# Patient Record
Sex: Female | Born: 1980 | Race: Black or African American | Hispanic: No | Marital: Married | Smoking: Former smoker
Health system: Southern US, Community
[De-identification: ages and names within clinical notes are randomized; demographics above are authoritative.]

## PROBLEM LIST (undated history)

## (undated) DIAGNOSIS — N289 Disorder of kidney and ureter, unspecified: Secondary | ICD-10-CM

## (undated) DIAGNOSIS — Z8759 Personal history of other complications of pregnancy, childbirth and the puerperium: Secondary | ICD-10-CM

## (undated) DIAGNOSIS — Z30017 Encounter for initial prescription of implantable subdermal contraceptive: Principal | ICD-10-CM

## (undated) DIAGNOSIS — R87629 Unspecified abnormal cytological findings in specimens from vagina: Secondary | ICD-10-CM

## (undated) HISTORY — DX: Personal history of other complications of pregnancy, childbirth and the puerperium: Z87.59

## (undated) HISTORY — DX: Encounter for initial prescription of implantable subdermal contraceptive: Z30.017

## (undated) HISTORY — PX: TONSILLECTOMY: SUR1361

## (undated) HISTORY — DX: Unspecified abnormal cytological findings in specimens from vagina: R87.629

---

## 2000-11-28 ENCOUNTER — Other Ambulatory Visit: Admission: RE | Admit: 2000-11-28 | Discharge: 2000-11-28 | Payer: Self-pay | Admitting: Obstetrics and Gynecology

## 2001-05-17 ENCOUNTER — Inpatient Hospital Stay (HOSPITAL_COMMUNITY): Admission: AD | Admit: 2001-05-17 | Discharge: 2001-05-19 | Payer: Self-pay | Admitting: Obstetrics and Gynecology

## 2010-04-13 ENCOUNTER — Other Ambulatory Visit: Admission: RE | Admit: 2010-04-13 | Discharge: 2010-04-13 | Payer: Self-pay | Admitting: Obstetrics & Gynecology

## 2010-07-08 ENCOUNTER — Ambulatory Visit (HOSPITAL_COMMUNITY)
Admission: RE | Admit: 2010-07-08 | Discharge: 2010-07-08 | Payer: Self-pay | Source: Home / Self Care | Attending: Obstetrics and Gynecology | Admitting: Obstetrics and Gynecology

## 2010-07-08 ENCOUNTER — Emergency Department (HOSPITAL_COMMUNITY)
Admission: EM | Admit: 2010-07-08 | Discharge: 2010-07-08 | Payer: Self-pay | Source: Home / Self Care | Admitting: Emergency Medicine

## 2010-07-08 LAB — COMPREHENSIVE METABOLIC PANEL
ALT: 37 U/L — ABNORMAL HIGH (ref 0–35)
AST: 26 U/L (ref 0–37)
Albumin: 3.1 g/dL — ABNORMAL LOW (ref 3.5–5.2)
Calcium: 8.8 mg/dL (ref 8.4–10.5)
Creatinine, Ser: 0.5 mg/dL (ref 0.4–1.2)
GFR calc Af Amer: >60 mL/min (ref >60)
Sodium: 135 mEq/L (ref 135–145)
Total Protein: 5.9 g/dL — ABNORMAL LOW (ref 6.0–8.3)

## 2010-07-08 LAB — DIFFERENTIAL
Basophils Absolute: 0.1 10*3/uL (ref 0.0–0.1)
Basophils Relative: 0 % (ref 0–1)
Eosinophils Absolute: 0.2 10*3/uL (ref 0.0–0.7)
Monocytes Absolute: 1.1 10*3/uL — ABNORMAL HIGH (ref 0.1–1.0)
Monocytes Relative: 8 % (ref 3–12)
Neutro Abs: 10 10*3/uL — ABNORMAL HIGH (ref 1.7–7.7)
Neutrophils Relative %: 72 % (ref 43–77)

## 2010-07-08 LAB — CBC
Hemoglobin: 9.2 g/dL — ABNORMAL LOW (ref 12.0–15.0)
MCH: 23 pg — ABNORMAL LOW (ref 26.0–34.0)
MCHC: 33.5 g/dL (ref 30.0–36.0)
Platelets: 197 10*3/uL (ref 150–400)

## 2010-07-08 LAB — URINALYSIS, ROUTINE W REFLEX MICROSCOPIC
Bilirubin Urine: NEGATIVE
Specific Gravity, Urine: 1.025 (ref 1.005–1.030)
Urine Glucose, Fasting: NEGATIVE mg/dL
Urobilinogen, UA: 0.2 mg/dL (ref 0.0–1.0)
pH: 6 (ref 5.0–8.0)

## 2010-07-08 LAB — URINE MICROSCOPIC-ADD ON

## 2010-07-08 LAB — POCT CARDIAC MARKERS
CKMB, poc: <1 ng/mL — ABNORMAL LOW (ref 1.0–8.0)
Myoglobin, poc: 16 ng/mL (ref 12–200)

## 2010-10-30 NOTE — Op Note (Signed)
[facility]  Patient:    Carrie Finley, Carrie Finley Visit Number: [id number] MRN: [id number]          Service Type: OBS Location: 4A A427 01 Attending Physician:  Tilda Burrow Dictated by:   Zerita Boers, CNM Admit Date:  05/17/2001   CC:         [facility]   Operative Report  ONSET OF LABOR:  May 17, 2001 at 12:30 p.m.  DATE OF DELIVERY:  May 17, 2001 at 2005 hours.  FIRST STAGE OF LABOR:  7 hours 20 minutes.  LENGTH OF SECOND STAGE OF LABOR:  15 minutes.  LENGTH OF THIRD STAGE OF LABOR:  6 minutes.  DELIVERY NOTE:  [name] had a normal spontaneous vaginal delivery from a rotation from occiput posterior to occiput anterior on perineum with spontaneous delivery of head.  Upon delivery of head, the nuchal cord was noted which was tight.  It was loosened without much difficulty and the infant spontaneously delivered without any difficulty.  Apgars were 9 and 9.  Infant weight was 7 pounds 15 ounces.  Female child without complications.  Perineum was intact upon inspection.  Third stage of labor was actively managed with 20 units of Pitocin and 1000 cc of D-5 lactated Ringers at a rapid rate. Placenta was delivered via Tomasa Blase mechanism.  Three-vessel cord was noted. Also prophylactically Hemabate 125 IM was given.  Estimated blood loss was approximately 300 cc.  Infant and mother were both stabilized and transferred out to the postpartum unit in stable condition. Dictated by:   Zerita Boers, CNM Attending Physician:  Tilda Burrow DD:  05/17/01 TD:  05/17/01 Job: [id number]

## 2010-11-01 ENCOUNTER — Inpatient Hospital Stay (HOSPITAL_COMMUNITY)
Admission: AD | Admit: 2010-11-01 | Discharge: 2010-11-03 | DRG: 775 | Disposition: A | Discharge status: Home / Self Care | Payer: Medicaid Other | Source: Ambulatory Visit | Attending: Obstetrics & Gynecology | Admitting: Obstetrics & Gynecology

## 2010-11-01 LAB — CBC
HCT: 33 % — ABNORMAL LOW (ref 36.0–46.0)
MCHC: 32.4 g/dL (ref 30.0–36.0)
Platelets: 115 10*3/uL — ABNORMAL LOW (ref 150–400)
RDW: 15.2 % (ref 11.5–15.5)

## 2010-11-02 LAB — ABO/RH: ABO/RH(D): O POS

## 2014-07-18 ENCOUNTER — Encounter: Payer: Self-pay | Admitting: Obstetrics and Gynecology

## 2014-07-18 ENCOUNTER — Ambulatory Visit (INDEPENDENT_AMBULATORY_CARE_PROVIDER_SITE_OTHER): Payer: BLUE CROSS/BLUE SHIELD | Admitting: Obstetrics and Gynecology

## 2014-07-18 VITALS — BP 100/60 | Ht 62.0 in | Wt 134.0 lb

## 2014-07-18 DIAGNOSIS — O034 Incomplete spontaneous abortion without complication: Secondary | ICD-10-CM

## 2014-07-18 NOTE — Patient Instructions (Signed)
Miscarriage A miscarriage is the sudden loss of an unborn baby (fetus) before the 20th week of pregnancy. Most miscarriages happen in the first 3 months of pregnancy. Sometimes, it happens before a woman even knows she is pregnant. A miscarriage is also called a "spontaneous miscarriage" or "early pregnancy loss." Having a miscarriage can be an emotional experience. Talk with your caregiver about any questions you may have about miscarrying, the grieving process, and your future pregnancy plans. CAUSES   Problems with the fetal chromosomes that make it impossible for the baby to develop normally. Problems with the baby's genes or chromosomes are most often the result of errors that occur, by chance, as the embryo divides and grows. The problems are not inherited from the parents.  Infection of the cervix or uterus.   Hormone problems.   Problems with the cervix, such as having an incompetent cervix. This is when the tissue in the cervix is not strong enough to hold the pregnancy.   Problems with the uterus, such as an abnormally shaped uterus, uterine fibroids, or congenital abnormalities.   Certain medical conditions.   Smoking, drinking alcohol, or taking illegal drugs.   Trauma.  Often, the cause of a miscarriage is unknown.  SYMPTOMS   Vaginal bleeding or spotting, with or without cramps or pain.  Pain or cramping in the abdomen or lower back.  Passing fluid, tissue, or blood clots from the vagina. DIAGNOSIS  Your caregiver will perform a physical exam. You may also have an ultrasound to confirm the miscarriage. Blood or urine tests may also be ordered. TREATMENT   Sometimes, treatment is not necessary if you naturally pass all the fetal tissue that was in the uterus. If some of the fetus or placenta remains in the body (incomplete miscarriage), tissue left behind may become infected and must be removed. Usually, a dilation and curettage (D and C) procedure is performed.  During a D and C procedure, the cervix is widened (dilated) and any remaining fetal or placental tissue is gently removed from the uterus.  Antibiotic medicines are prescribed if there is an infection. Other medicines may be given to reduce the size of the uterus (contract) if there is a lot of bleeding.  If you have Rh negative blood and your baby was Rh positive, you will need a Rh immunoglobulin shot. This shot will protect any future baby from having Rh blood problems in future pregnancies. HOME CARE INSTRUCTIONS   Your caregiver may order bed rest or may allow you to continue light activity. Resume activity as directed by your caregiver.  Have someone help with home and family responsibilities during this time.   Keep track of the number of sanitary pads you use each day and how soaked (saturated) they are. Write down this information.   Do not use tampons. Do not douche or have sexual intercourse until approved by your caregiver.   Only take over-the-counter or prescription medicines for pain or discomfort as directed by your caregiver.   Do not take aspirin. Aspirin can cause bleeding.   Keep all follow-up appointments with your caregiver.   If you or your partner have problems with grieving, talk to your caregiver or seek counseling to help cope with the pregnancy loss. Allow enough time to grieve before trying to get pregnant again.  SEEK IMMEDIATE MEDICAL CARE IF:   You have severe cramps or pain in your back or abdomen.  You have a fever.  You pass large blood clots (walnut-sized   or larger) ortissue from your vagina. Save any tissue for your caregiver to inspect.   Your bleeding increases.   You have a thick, bad-smelling vaginal discharge.  You become lightheaded, weak, or you faint.   You have chills.  MAKE SURE YOU:  Understand these instructions.  Will watch your condition.  Will get help right away if you are not doing well or get  worse. Document Released: 11/24/2000 Document Revised: 09/25/2012 Document Reviewed: 07/20/2011 Exit[employer] Patient Information 2015 [employer]. This information is not intended to replace advice given to you by your health care provider. Make sure you discuss any questions you have with your health care provider.  

## 2014-07-18 NOTE — Progress Notes (Signed)
Patient ID: Carrie MannsJamie L Kleen, female   DOB: July 25, 1980, 34 y.o.   MRN: [id number] Pt here today for follow up from miscarriage. Pt states that she is still having a lot of pain and heavy bleeding. LMP 04/03/14. Pt had US while at ER.

## 2014-07-18 NOTE — Progress Notes (Signed)
Patient ID: Carrie MannsJamie L Marines, female   DOB: 29-Jan-1981, 34 y.o.   MRN: [id number]   [facility]  Patient name: Carrie Finley MRN [id number]  Date of birth: 29-Jan-1981  CC & HPI:  Carrie Finley is a 34 y.o. female presenting today for a follow-up of a miscarriage.  She went to an ED in [place] 5 days ago due to vaginal bleeding and had a transvaginal u/s which revealed that the fetus did not have a heartbeat.  She was not given Cytotec at that time and has been experiencing heavy vaginal bleeding and lower abdominal cramping since that visit.  Her last LNMP was in October 2015.  Her blood type is O+.     ROS:  All systems have been reviewed and are negative unless otherwise indicated in the HPI.  Pertinent History Reviewed:   Reviewed: Significant for  Medical        History reviewed. No pertinent past medical history.                            Surgical Hx:    Past Surgical History  Procedure Laterality Date  . Tonsillectomy     Medications: Reviewed & Updated - see associated section                       Current outpatient prescriptions:  .  cephALEXin (KEFLEX) 500 MG capsule, Take 500 mg by mouth 4 (four) times daily., Disp: , Rfl:  .  HYDROcodone-acetaminophen (NORCO/VICODIN) 5-325 MG per tablet, Take 1 tablet by mouth every 6 (six) hours as needed for moderate pain., Disp: , Rfl:    Social History: Reviewed -  reports that she has never smoked. She has never used smokeless tobacco.  Objective Findings:  Vitals: Blood pressure 100/60, height 5\' 2"  (1.575 m), weight 134 lb (60.782 kg).  Physical Examination: General appearance - alert, well appearing, and in no distress, oriented to person, place, and time and normal appearing weight Pelvic - VULVA: normal appearing vulva with no masses, tenderness or lesions,  VAGINA: normal appearing vagina with normal color and discharge, no lesions,  CERVIX: open 2-3cm, large amount of old, dark blood, tissue fragments,  and clots UTERUS: uterus is normal size, shape, consistency and nontender,  ADNEXA: normal adnexa in size, nontender and no masses  Removed large pieces of tissue fragments and clots , with portions sent for tissue confirmation.  Gyn Ultrasound done at completion of removal of tissue from  Assessment & Plan:   A:  1. Miscarriage now completed  2. O positive  P:  1. Follow-up in 10 days  This chart was scribed for Tilda BurrowJohn Havannah Streat V, MD by Carl Bestelina Holson, ED Scribe. This patient was seen in Room 2 and the patient's care was started at 11:00 AM.

## 2014-07-29 ENCOUNTER — Ambulatory Visit: Payer: BLUE CROSS/BLUE SHIELD | Admitting: Obstetrics and Gynecology

## 2014-07-30 ENCOUNTER — Encounter: Payer: Self-pay | Admitting: Obstetrics and Gynecology

## 2014-08-08 ENCOUNTER — Ambulatory Visit (INDEPENDENT_AMBULATORY_CARE_PROVIDER_SITE_OTHER): Payer: BLUE CROSS/BLUE SHIELD | Admitting: Adult Health

## 2014-08-08 ENCOUNTER — Encounter: Payer: Self-pay | Admitting: Adult Health

## 2014-08-08 VITALS — BP 118/76 | HR 83 | Ht 62.0 in | Wt 137.5 lb

## 2014-08-08 DIAGNOSIS — Z8742 Personal history of other diseases of the female genital tract: Secondary | ICD-10-CM

## 2014-08-08 DIAGNOSIS — Z8759 Personal history of other complications of pregnancy, childbirth and the puerperium: Secondary | ICD-10-CM

## 2014-08-08 NOTE — Patient Instructions (Addendum)
No sex Call if wants nexplanon after insurance checked

## 2014-08-08 NOTE — Progress Notes (Signed)
Subjective:     Patient ID: Carrie Finley, female   DOB: Nov 11, 1980, 34 y.o.   MRN: [id number]  HPI Carrie Finley is a 34 year old black female in for follow up of recent miscarriage.Was seen 2/4 by Dr Emelda FearFerguson then and had tissue removed and US.Bleeding has stopped and has no pain.May want nexplanon.Bllod type O+ per chart.  Review of Systems  Patient denies any headaches, hearing loss, fatigue, blurred vision, shortness of breath, chest pain, abdominal pain, problems with bowel movements, urination, or intercourse(not having sex). No joint pain or mood swings.  Reviewed past medical,surgical, social and family history. Reviewed medications and allergies.     Objective:   Physical Exam BP 118/76 mmHg  Pulse 83  Ht 5\' 2"  (1.575 m)  Wt 137 lb 8 oz (62.37 kg)  BMI 25.14 kg/m2   Skin warm and dry.Pelvic: external genitalia is normal in appearance no lesions, vagina: tan discharge without odor,urethra has no lesions or masses noted, cervix:smooth and bulbous, uterus: normal size, shape and contour, non tender, no masses felt, adnexa: no masses or tenderness noted. Bladder is non tender and no masses felt. Discussed nexplanon and she wants to consider this, after insurance check.  Assessment:     Recent miscarriage    Plan:    No sex til after nexplanon inserted Check QHCG Check insurance on nexplanon by Angie and call pt Review handout on nexplanon, if wants nexplanon after insurance check, [facility] and [facility]on in pm

## 2014-08-09 LAB — HCG, QUANTITATIVE, PREGNANCY: hCG Quant: 17 m[IU]/mL

## 2014-08-12 ENCOUNTER — Telehealth: Payer: Self-pay | Admitting: Adult Health

## 2014-08-12 NOTE — Telephone Encounter (Signed)
Wrong number, was [place]

## 2014-08-22 ENCOUNTER — Ambulatory Visit (INDEPENDENT_AMBULATORY_CARE_PROVIDER_SITE_OTHER): Payer: BLUE CROSS/BLUE SHIELD | Admitting: Adult Health

## 2014-08-22 ENCOUNTER — Encounter: Payer: Self-pay | Admitting: Adult Health

## 2014-08-22 VITALS — BP 128/70 | HR 70 | Ht 62.0 in | Wt 139.0 lb

## 2014-08-22 DIAGNOSIS — Z3202 Encounter for pregnancy test, result negative: Secondary | ICD-10-CM

## 2014-08-22 DIAGNOSIS — Z30017 Encounter for initial prescription of implantable subdermal contraceptive: Secondary | ICD-10-CM

## 2014-08-22 DIAGNOSIS — Z30018 Encounter for initial prescription of other contraceptives: Secondary | ICD-10-CM | POA: Diagnosis not present

## 2014-08-22 HISTORY — DX: Encounter for initial prescription of implantable subdermal contraceptive: Z30.017

## 2014-08-22 LAB — POCT URINE PREGNANCY: Preg Test, Ur: NEGATIVE

## 2014-08-22 NOTE — Progress Notes (Signed)
Subjective:     Patient ID: Carrie Finley, female   DOB: [birth year], 34 y.o.   MRN: [id number]  HPI Carrie Finley is a 34 year old black female in for nexplanon insertion.  Review of Systems For nexplanon insertion, Patient denies any headaches, hearing loss, fatigue, blurred vision, shortness of breath, chest pain, abdominal pain, problems with bowel movements, urination, or intercourse. No joint pain or mood swings. Reviewed past medical,surgical, social and family history. Reviewed medications and allergies.     Objective:   Physical Exam BP 128/70 mmHg  Pulse 70  Ht 5\' 2"  (1.575 m)  Wt 139 lb (63.05 kg)  BMI 25.42 kg/m2  LMP 08/19/2014 UPT negative, Consent signed, time out called. Left arm cleansed with betadine, and injected with 1.5 cc 2% lidocaine and waited til numb. Nexplanon easily inserted and steri strips applied.Rod easily palpated by provider and pt. Pressure dressing applied.    Assessment:     Nexplanon insertion lot [id number] exp 3/18    Plan:     Use condoms x 2 weeks, keep clean and dry x 24 hours, no heavy lifting, keep steri strips on x 72 hours, Keep pressure dressing on x 24 hours. Follow up prn problems.   Return in 3 months for pap and physical

## 2014-08-22 NOTE — Patient Instructions (Signed)
Use condoms x 2 weeks, keep clean and dry x 24 hours, no heavy lifting, keep steri strips on x 72 hours, Keep pressure dressing on x 24 hours. Follow up prn problems. Return in 3 months for pap and physical

## 2014-11-21 ENCOUNTER — Other Ambulatory Visit: Payer: BLUE CROSS/BLUE SHIELD | Admitting: Adult Health

## 2014-11-21 ENCOUNTER — Encounter: Payer: Self-pay | Admitting: Adult Health

## 2020-11-07 ENCOUNTER — Emergency Department (HOSPITAL_COMMUNITY): Payer: BC Managed Care – PPO

## 2020-11-07 ENCOUNTER — Encounter (HOSPITAL_COMMUNITY): Payer: Self-pay

## 2020-11-07 ENCOUNTER — Emergency Department (HOSPITAL_COMMUNITY)
Admission: EM | Admit: 2020-11-07 | Discharge: 2020-11-07 | Disposition: A | Discharge status: Home / Self Care | Payer: BC Managed Care – PPO | Attending: Emergency Medicine | Admitting: Emergency Medicine

## 2020-11-07 ENCOUNTER — Other Ambulatory Visit: Payer: Self-pay

## 2020-11-07 DIAGNOSIS — N10 Acute tubulo-interstitial nephritis: Secondary | ICD-10-CM | POA: Insufficient documentation

## 2020-11-07 DIAGNOSIS — Z87891 Personal history of nicotine dependence: Secondary | ICD-10-CM | POA: Insufficient documentation

## 2020-11-07 DIAGNOSIS — R1011 Right upper quadrant pain: Secondary | ICD-10-CM

## 2020-11-07 DIAGNOSIS — N12 Tubulo-interstitial nephritis, not specified as acute or chronic: Secondary | ICD-10-CM

## 2020-11-07 HISTORY — DX: Disorder of kidney and ureter, unspecified: N28.9

## 2020-11-07 LAB — CBC
HCT: 34.3 % — ABNORMAL LOW (ref 36.0–46.0)
Hemoglobin: 11.1 g/dL — ABNORMAL LOW (ref 12.0–15.0)
MCH: 23.8 pg — ABNORMAL LOW (ref 26.0–34.0)
MCHC: 32.4 g/dL (ref 30.0–36.0)
MCV: 73.6 fL — ABNORMAL LOW (ref 80.0–100.0)
Platelets: 268 10*3/uL (ref 150–400)
RBC: 4.66 MIL/uL (ref 3.87–5.11)
RDW: 14.3 % (ref 11.5–15.5)
WBC: 9.8 10*3/uL (ref 4.0–10.5)
nRBC: 0 % (ref 0.0–0.2)

## 2020-11-07 LAB — LIPASE, BLOOD: Lipase: 33 U/L (ref 11–51)

## 2020-11-07 LAB — URINALYSIS, ROUTINE W REFLEX MICROSCOPIC
Bilirubin Urine: NEGATIVE
Glucose, UA: NEGATIVE mg/dL
Hgb urine dipstick: NEGATIVE
Ketones, ur: NEGATIVE mg/dL
Nitrite: NEGATIVE
Protein, ur: NEGATIVE mg/dL
Specific Gravity, Urine: 1.014 (ref 1.005–1.030)
pH: 7 (ref 5.0–8.0)

## 2020-11-07 LAB — COMPREHENSIVE METABOLIC PANEL
ALT: 30 U/L (ref 0–44)
AST: 30 U/L (ref 15–41)
Albumin: 3.7 g/dL (ref 3.5–5.0)
Alkaline Phosphatase: 72 U/L (ref 38–126)
Anion gap: 10 (ref 5–15)
BUN: 8 mg/dL (ref 6–20)
CO2: 22 mmol/L (ref 22–32)
Calcium: 9.2 mg/dL (ref 8.9–10.3)
Chloride: 104 mmol/L (ref 98–111)
Creatinine, Ser: 0.7 mg/dL (ref 0.44–1.00)
GFR, Estimated: >60 mL/min (ref >60)
Glucose, Bld: 96 mg/dL (ref 70–99)
Potassium: 3.9 mmol/L (ref 3.5–5.1)
Sodium: 136 mmol/L (ref 135–145)
Total Bilirubin: 0.7 mg/dL (ref 0.3–1.2)
Total Protein: 7.3 g/dL (ref 6.5–8.1)

## 2020-11-07 LAB — I-STAT BETA HCG BLOOD, ED (MC, WL, AP ONLY): I-stat hCG, quantitative: <5 m[IU]/mL (ref <5)

## 2020-11-07 MED ORDER — ONDANSETRON 4 MG PO TBDP: 4.0000 mg | ORAL_TABLET | Freq: Three times a day (TID) | ORAL | 0 refills | Status: AC | PRN

## 2020-11-07 MED ORDER — ONDANSETRON 4 MG PO TBDP
4.0000 mg | ORAL_TABLET | Freq: Once | ORAL | Status: AC
  Administered 2020-11-07: 4 mg via ORAL
  Filled 2020-11-07: qty 1

## 2020-11-07 MED ORDER — OXYCODONE-ACETAMINOPHEN 5-325 MG PO TABS
1.0000 | ORAL_TABLET | Freq: Once | ORAL | Status: AC
  Administered 2020-11-07: 1 via ORAL
  Filled 2020-11-07: qty 1

## 2020-11-07 MED ORDER — SULFAMETHOXAZOLE-TRIMETHOPRIM 800-160 MG PO TABS: 1.0000 | ORAL_TABLET | Freq: Two times a day (BID) | ORAL | 0 refills | Status: AC

## 2020-11-07 MED ORDER — SULFAMETHOXAZOLE-TRIMETHOPRIM 800-160 MG PO TABS
1.0000 | ORAL_TABLET | Freq: Once | ORAL | Status: AC
  Administered 2020-11-07: 1 via ORAL
  Filled 2020-11-07: qty 1

## 2020-11-07 MED ORDER — OXYCODONE-ACETAMINOPHEN 5-325 MG PO TABS: 1.0000 | ORAL_TABLET | Freq: Four times a day (QID) | ORAL | 0 refills | Status: AC | PRN

## 2020-11-07 NOTE — ED Provider Notes (Signed)
Emergency Medicine Provider Triage Evaluation Note  Carrie Finley , a 40 y.o. female  was evaluated in triage.  Pt complains of abd pain that started a few days ago. Pain was initially in the luq and now it is located to the [place]. Reports nausea, but no vomiting or diarrhea. Denies fevers, urinary sxs  Review of Systems  Positive: abd pain, nausea, Negative: Fevers, vomiting, diarrhea, fevers, urinary sxs  Physical Exam  BP 134/82 (BP Location: Left Arm)   Pulse (!) 101   Temp 98.6 F (37 C) (Oral)   Resp 17   Ht 5\' 2"  (1.575 m)   Wt 68.5 kg   LMP 10/31/2020 (Approximate)   SpO2 100%   BMI 27.62 kg/m  Gen:   Awake, no distress   Resp:  Normal effort  MSK:   Moves extremities without difficulty  Other:  Epigastric, luq and ruq abd ttp  Medical Decision Making  Medically screening exam initiated at 3:29 PM.  Appropriate orders placed.  ARYN SAFRAN was informed that the remainder of the evaluation will be completed by another provider, this initial triage assessment does not replace that evaluation, and the importance of remaining in the ED until their evaluation is complete.     Roxy Manns 11/07/20 1532    11/09/20, MD 11/08/20 2016

## 2020-11-07 NOTE — Discharge Instructions (Addendum)
It is important that you stay hydrated. Take the antibiotic, bactrim, every 12 hours until gone. You can take zofran as directed for nausea. You can take the percocet for severe pain. Be aware this medication can make you drowsy. Do not operate motor vehicles or drink alcohol while taking this medication. Follow-up with your primary care provider within 3 to 5 days. Discuss your incidental finding with your liver for nonemergent MRI.  Return to the emergency department for high fever, uncontrollable vomiting, or severely worsening symptoms. Return for shortness of breath, chest pain, coughing up blood.

## 2020-11-07 NOTE — ED Provider Notes (Signed)
[facility]-EMERGENCY DEPT Provider Note   CSN: [id number] Arrival date & time: 11/07/20  1442     History Chief Complaint  Patient presents with  . Abdominal Pain    Carrie Finley is a 40 y.o. female w PMHx nephrolithiasis, presenting for evaluation of abdominal pain that began on Sunday. Patient was initially LUQ and has migrated to RUQ.  She states she was evaluated on Wednesday at urgent care who instructed her to stop taking the ibuprofen that she was taking to treat her pain.  They recommended Nexium.  Symptoms are not improved.  Pain now favors a right upper quadrant radiating towards flank.  Abdominal pain is worse with deep breathing so she is not short of breath.  She has a lot of nausea without vomiting.  Denies diarrhea or constipation.  Denies urinary symptoms or fever.  Reports history of nephrolithiasis though symptoms feel different  The history is provided by the patient.       Past Medical History:  Diagnosis Date  . History of miscarriage   . Nexplanon insertion 08/22/2014   Inserted 08/22/14 left arm   . Renal disorder    kidney stones  . Vaginal Pap smear, abnormal     Patient Active Problem List   Diagnosis Date Noted  . Nexplanon insertion 08/22/2014  . History of miscarriage 08/08/2014    Past Surgical History:  Procedure Laterality Date  . TONSILLECTOMY       OB History    Gravida  4   Para  3   Term      Preterm      AB  1   Living  3     SAB  1   IAB      Ectopic      Multiple      Live Births              Family History  Problem Relation Age of Onset  . Lupus Sister     Social History   Tobacco Use  . Smoking status: Former Games developer  . Smokeless tobacco: Never Used  Vaping Use  . Vaping Use: Never used  Substance Use Topics  . Alcohol use: Yes    Comment: occ  . Drug use: No    Home Medications Prior to Admission medications   Medication Sig Start Date End Date Taking? Authorizing  Provider  ondansetron (ZOFRAN ODT) 4 MG disintegrating tablet Take 1 tablet (4 mg total) by mouth every 8 (eight) hours as needed for nausea or vomiting. 11/07/20  Yes Steadman Prosperi, [place] N, PA-C  oxyCODONE-acetaminophen (PERCOCET/ROXICET) 5-325 MG tablet Take 1 tablet by mouth every 6 (six) hours as needed for severe pain. 11/07/20  Yes Roxan Hockey, [place] N, PA-C  sulfamethoxazole-trimethoprim (BACTRIM DS) 800-160 MG tablet Take 1 tablet by mouth 2 (two) times daily for 10 days. 11/07/20 11/17/20 Yes Yarissa Reining, [place] N, PA-C    Allergies    Patient has no known allergies.  Review of Systems   Review of Systems  All other systems reviewed and are negative.   Physical Exam Updated Vital Signs BP 123/89   Pulse 76   Temp 98.6 F (37 C) (Oral)   Resp 18   Ht 5\' 2"  (1.575 m)   Wt 68.5 kg   LMP 10/31/2020 (Approximate)   SpO2 100%   BMI 27.62 kg/m   Physical Exam Vitals and nursing note reviewed.  Constitutional:      Appearance: She is well-developed.  HENT:     Head: Normocephalic and atraumatic.  Eyes:     Conjunctiva/sclera: Conjunctivae normal.  Cardiovascular:     Rate and Rhythm: Normal rate and regular rhythm.  Pulmonary:     Effort: Pulmonary effort is normal. No respiratory distress.     Breath sounds: Normal breath sounds.  Abdominal:     General: Bowel sounds are normal.     Palpations: Abdomen is soft.     Tenderness: There is abdominal tenderness in the right upper quadrant and epigastric area. There is right CVA tenderness. There is no guarding or rebound.     Comments: No skin changes  Skin:    General: Skin is warm.  Neurological:     Mental Status: She is alert.  Psychiatric:        Behavior: Behavior normal.     ED Results / Procedures / Treatments   Labs (all labs ordered are listed, but only abnormal results are displayed) Labs Reviewed  CBC - Abnormal; Notable for the following components:      Result Value   Hemoglobin 11.1 (*)    HCT 34.3 (*)     MCV 73.6 (*)    MCH 23.8 (*)    All other components within normal limits  URINALYSIS, ROUTINE W REFLEX MICROSCOPIC - Abnormal; Notable for the following components:   Leukocytes,Ua SMALL (*)    Bacteria, UA MANY (*)    All other components within normal limits  URINE CULTURE  LIPASE, BLOOD  COMPREHENSIVE METABOLIC PANEL  I-STAT BETA HCG BLOOD, ED ([facility], [facility], [facility] ONLY)    EKG None  Radiology CT Abdomen Pelvis Wo Contrast  Result Date: 11/07/2020 CLINICAL DATA:  Mid to upper abdominal pain EXAM: CT ABDOMEN AND PELVIS WITHOUT CONTRAST TECHNIQUE: Multidetector CT imaging of the abdomen and pelvis was performed following the standard protocol without IV contrast. COMPARISON:  Abdominal ultrasound 11/07/2020 FINDINGS: Lower chest: Lung bases are clear. Normal heart size. No pericardial effusion. Hepatobiliary: Hypoattenuating focus identified on comparison abdominal ultrasound is not well visualized on this unenhanced CT. Small subcapsular hypoattenuating focus along the anterior left lobe (2/16) too small to fully characterize on CT imaging but statistically likely benign. No other focal liver abnormality. Smooth surface contour. Normal attic attenuation. No visible calcified gallstone. No pericholecystic fluid or inflammation. No biliary ductal dilatation. Pancreas: No pancreatic ductal dilatation or surrounding inflammatory changes. Spleen: Normal in size. No concerning splenic lesions. Adrenals/Urinary Tract: Normal adrenal glands. Kidneys are symmetric in size and normally located. Bilateral punctate nonobstructing calculi in both kidneys. No obstructive urolithiasis or hydronephrosis is seen. No visible or contour deforming renal lesions. Urinary bladder is largely decompressed at the time of exam and therefore poorly evaluated by CT imaging. Mild bladder wall thickening may be related to underdistention. Stomach/Bowel: Distal esophagus, stomach and duodenum are unremarkable. Normal duodenal sweep  across the midline abdomen. No small bowel thickening or dilatation. Appendix is not visualized. No focal inflammation the vicinity of the cecum to suggest an occult appendicitis. No colonic dilatation or wall thickening. Vascular/Lymphatic: No significant vascular findings are present. No enlarged abdominal or pelvic lymph nodes. Reproductive: Anteverted uterus.  No concerning adnexal lesions. Other: No abdominopelvic free fluid or free gas. No bowel containing hernias. Musculoskeletal: No acute osseous abnormality or suspicious osseous lesion. IMPRESSION: Mild bladder wall thickening, possibly related to underdistention. Correlate for urinary symptoms and consider urinalysis. Bilateral nonobstructing nephrolithiasis. No obstructive urolith or hydronephrosis at this time. No other acute or suspicious abdominopelvic abnormality to  provide cause for patient's symptoms. Hypoattenuating focus in the left lobe liver on comparison ultrasound is not well visualized on CT. MR imaging as an on an outpatient basis would further evaluate this finding. Electronically Signed   By: Kreg ShropshirePrice  DeHay M.D.   On: 11/07/2020 19:23   US Abdomen Limited RUQ (LIVER/GB)  Result Date: 11/07/2020 CLINICAL DATA:  40 year old female with right upper quadrant abdominal pain. EXAM: ULTRASOUND ABDOMEN LIMITED RIGHT UPPER QUADRANT COMPARISON:  None. FINDINGS: Gallbladder: No gallstones or wall thickening visualized. No sonographic Murphy sign noted by sonographer. Common bile duct: Diameter: 5 mm Liver: There is a 1.7 x 2.1 x 1.8 cm echogenic lesion in the left lobe of the liver which is not characterized on this ultrasound but may represent a hemangioma. Further characterization with MRI without and with contrast is recommended. Portal vein is patent on color Doppler imaging with normal direction of blood flow towards the liver. Other: None. IMPRESSION: Indeterminate lesion in the left lobe of the liver may represent a hemangioma. Further  evaluation with MRI is recommended. Electronically Signed   By: Elgie CollardArash  Radparvar M.D.   On: 11/07/2020 17:28    Procedures Procedures   Medications Ordered in ED Medications  sulfamethoxazole-trimethoprim (BACTRIM DS) 800-160 MG per tablet 1 tablet (has no administration in time range)  oxyCODONE-acetaminophen (PERCOCET/ROXICET) 5-325 MG per tablet 1 tablet (1 tablet Oral Given 11/07/20 1925)  ondansetron (ZOFRAN-ODT) disintegrating tablet 4 mg (4 mg Oral Given 11/07/20 1925)    ED Course  I have reviewed the triage vital signs and the nursing notes.  Pertinent labs & imaging results that were available during my care of the patient were reviewed by me and considered in my medical decision making (see chart for details).    MDM Rules/Calculators/A&P                          Patient is presenting for right upper quadrant abdominal pain getting to the flank with some mild nausea, no vomiting.  She has tenderness in the upper abdomen, worse in the right upper quadrant and does radiate around the right flank.  She is afebrile here but does appear uncomfortable.  Right upper quadrant ultrasound that was ordered in triage shows no gallbladder abnormality.  Does show area in the liver that is favoring hemangioma.  However this would not account for patient's pain.  There are no skin changes.  Blood counts are overall reassuring.  UA is pending.  Considered PE, however she is not short of breath, pain is very reproducible with palpation the abdomen.  No signs are normal, no tachycardia or hypoxia, no tachypnea.  Will obtain CT scan abdomen for further assessment.  Pain and nausea treated.   CT scan is negative for acute or abnormal findings.  UA has resulted and is consistent with infection. Culture sent. Suspect patient's symptoms may be attributed to pyelonephritis.  Will treat with Bactrim, and send with prescription for symptom management.  She has improvement with treatment here.   Discussed  with patient that presentation does not seem consistent with PE though does have some risk due to her OCPs.  If she develops any shortness of breath, chest pain, hemoptysis, or any other concerning symptoms, she is to return to the ED immediately for reevaluation.  She verbalized understanding of this and agrees with care plan for discharge.  Patient is instructed to follow with her PCP for nonemergent outpatient MRI of her liver for further  assessment of the abnormality on her ultrasound.  Discussed results, findings, treatment and follow up. Patient advised of return precautions. Patient verbalized understanding and agreed with plan.  [place] Controlled Substance reporting System queried  Final Clinical Impression(s) / ED Diagnoses Final diagnoses:  RUQ pain  Pyelonephritis    Rx / DC Orders ED Discharge Orders         Ordered    sulfamethoxazole-trimethoprim (BACTRIM DS) 800-160 MG tablet  2 times daily        11/07/20 2054    ondansetron (ZOFRAN ODT) 4 MG disintegrating tablet  Every 8 hours PRN        11/07/20 2054    oxyCODONE-acetaminophen (PERCOCET/ROXICET) 5-325 MG tablet  Every 6 hours PRN        11/07/20 2054           Geordie Nooney, [place] N, PA-C 11/07/20 2055    Gwyneth Sprout, MD 11/10/20 [phone]

## 2020-11-07 NOTE — ED Triage Notes (Signed)
Patient c/o right abdominal pain that started last night. Patient denies any n/v/d.

## 2020-11-10 LAB — URINE CULTURE: Culture: >=100000 — AB

## 2020-11-11 ENCOUNTER — Telehealth: Payer: Self-pay | Admitting: *Deleted

## 2020-11-11 NOTE — Telephone Encounter (Signed)
Post ED Visit - Positive Culture Follow-up  Culture report reviewed by antimicrobial stewardship pharmacist: [facility] []  , Pharm.D. []  Enzo Bi, Pharm.D., BCPS AQ-ID []  , Pharm.D., BCPS []  Celedonio Miyamoto, Pharm.D., BCPS []  [place], Garvin Fila.D., BCPS, AAHIVP []  , Pharm.D., BCPS, AAHIVP []  Georgina Pillion, PharmD, BCPS []  , PharmD, BCPS []  [place], PharmD, BCPS []  [address], PharmD []  , PharmD, BCPS []  Estella Husk, PharmD  Pharmacy Team []  Lysle Pearl, PharmD []  , PharmD []  Phillips Climes, PharmD []  , Rph []  Agap[employer]) , PharmD []  Verlan Friends, PharmD []  , PharmD []  Mervyn Gay, PharmD []  , PharmD []  Vinnie Level, PharmD []  [facility], PharmD []  , PharmD []  Len Childs, PharmD   Positive urine culture Treated with Sulfamethoxazole-Trimethoprim, organism sensitive to the same and no further patient follow-up is required at this time.  , PharmD Greer Pickerel Talley 11/11/2020, 8:09 AM

## 2022-09-02 ENCOUNTER — Ambulatory Visit
Admission: RE | Admit: 2022-09-02 | Discharge: 2022-09-02 | Disposition: A | Discharge status: Home / Self Care | Payer: BC Managed Care – PPO | Source: Ambulatory Visit

## 2022-09-02 VITALS — BP 155/92 | HR 73 | Temp 98.6°F | Resp 18

## 2022-09-02 DIAGNOSIS — J020 Streptococcal pharyngitis: Secondary | ICD-10-CM | POA: Diagnosis not present

## 2022-09-02 LAB — POCT RAPID STREP A (OFFICE): Rapid Strep A Screen: POSITIVE — AB

## 2022-09-02 MED ORDER — PENICILLIN V POTASSIUM 500 MG PO TABS: 500.0000 mg | ORAL_TABLET | Freq: Two times a day (BID) | ORAL | 0 refills | Status: AC

## 2022-09-02 MED ORDER — LIDOCAINE VISCOUS HCL 2 % MT SOLN: 15.0000 mL | Freq: Four times a day (QID) | OROMUCOSAL | 0 refills | Status: AC | PRN

## 2022-09-02 NOTE — ED Provider Notes (Signed)
[facility]    CSN: [id number] Arrival date & time: 09/02/22  1441      History   Chief Complaint Chief Complaint  Patient presents with   Sore Throat    Boyfriend just tested positive for strep throat. - Entered by patient    HPI Carrie Finley is a 42 y.o. female.   The history is provided by the patient.    The patient presents for complaints of sore throat that been present over the past several days.  Patient denies fever, chills, ear pain, cough, abdominal pain, nausea, vomiting, or diarrhea.  She does endorse an intermittent headache.  Patient states that her son was diagnosed with strep throat approximately a week ago, and her boyfriend was diagnosed with strep approximately 2 days ago.  She states that she has been taking over-the-counter Advil for her symptoms.  Past Medical History:  Diagnosis Date   History of miscarriage    Nexplanon insertion 08/22/2014   Inserted 08/22/14 left arm    Renal disorder    kidney stones   Vaginal Pap smear, abnormal     Patient Active Problem List   Diagnosis Date Noted   Nexplanon insertion 08/22/2014   History of miscarriage 08/08/2014    Past Surgical History:  Procedure Laterality Date   TONSILLECTOMY      OB History     Gravida  4   Para  3   Term      Preterm      AB  1   Living  3      SAB  1   IAB      Ectopic      Multiple      Live Births               Home Medications    Prior to Admission medications   Medication Sig Start Date End Date Taking? Authorizing Provider  ferrous sulfate 324 MG TBEC Take 324 mg by mouth.   Yes [provider]  lidocaine (XYLOCAINE) 2 % solution Use as directed 15 mLs in the mouth or throat every 6 (six) hours as needed for mouth pain. Gargle and spit 5 mL every 6 hours as needed for throat pain. 09/02/22  Yes Barry Culverhouse-Warren, Alda Lea, NP  Norethindrone Acetate-Ethinyl Estrad-FE (BLISOVI 24 FE) 1-20 MG-MCG(24) tablet Take 1 tablet by  mouth daily.   Yes [provider]  penicillin v potassium (VEETID) 500 MG tablet Take 1 tablet (500 mg total) by mouth 2 (two) times daily for 10 days. 09/02/22 09/12/22 Yes Finleigh Cheong-Warren, Alda Lea, NP  ondansetron (ZOFRAN ODT) 4 MG disintegrating tablet Take 1 tablet (4 mg total) by mouth every 8 (eight) hours as needed for nausea or vomiting. 11/07/20   Robinson, [place] N, PA-C  oxyCODONE-acetaminophen (PERCOCET/ROXICET) 5-325 MG tablet Take 1 tablet by mouth every 6 (six) hours as needed for severe pain. 11/07/20   Robinson, [place] N, PA-C    Family History Family History  Problem Relation Age of Onset   Lupus Sister     Social History Social History   Tobacco Use   Smoking status: Former   Smokeless tobacco: Never  Scientific laboratory technician Use: Never used  Substance Use Topics   Alcohol use: Yes    Comment: occ   Drug use: No     Allergies   Patient has no known allergies.   Review of Systems Review of Systems Per HPI  Physical Exam Triage Vital  Signs ED Triage Vitals [09/02/22 1446]  Enc Vitals Group     BP (!) 155/92     Pulse Rate 73     Resp 18     Temp 98.6 F (37 C)     Temp Source Oral     SpO2 98 %     Weight      Height      Head Circumference      Peak Flow      Pain Score 7     Pain Loc      Pain Edu?      Excl. in [place]?    No data found.  Updated Vital Signs BP (!) 155/92 (BP Location: Right Arm)   Pulse 73   Temp 98.6 F (37 C) (Oral)   Resp 18   LMP 08/30/2022 (Exact Date)   SpO2 98%   Visual Acuity Right Eye Distance:   Left Eye Distance:   Bilateral Distance:    Right Eye Near:   Left Eye Near:    Bilateral Near:     Physical Exam Vitals and nursing note reviewed.  Constitutional:      General: She is not in acute distress.    Appearance: She is well-developed.  HENT:     Head: Normocephalic.     Right Ear: Tympanic membrane and ear canal normal.     Left Ear: Tympanic membrane and ear canal normal.     Nose: No  congestion or rhinorrhea.     Mouth/Throat:     Mouth: Mucous membranes are moist.     Pharynx: Uvula midline. Pharyngeal swelling, posterior oropharyngeal erythema and uvula swelling present. No oropharyngeal exudate.     Tonsils: No tonsillar exudate. 2+ on the right. 2+ on the left.  Eyes:     Conjunctiva/sclera: Conjunctivae normal.     Pupils: Pupils are equal, round, and reactive to light.  Cardiovascular:     Rate and Rhythm: Normal rate and regular rhythm.     Heart sounds: Normal heart sounds.  Pulmonary:     Effort: Pulmonary effort is normal. No respiratory distress.     Breath sounds: Normal breath sounds. No stridor. No wheezing, rhonchi or rales.  Abdominal:     Palpations: Abdomen is soft.     Tenderness: There is no abdominal tenderness.  Musculoskeletal:     Cervical back: Normal range of motion.  Lymphadenopathy:     Cervical: No cervical adenopathy.  Skin:    General: Skin is warm and dry.  Neurological:     General: No focal deficit present.     Mental Status: She is alert and oriented to person, place, and time.  Psychiatric:        Mood and Affect: Mood normal.        Behavior: Behavior normal.      UC Treatments / Results  Labs (all labs ordered are listed, but only abnormal results are displayed) Labs Reviewed  POCT RAPID STREP A (OFFICE) - Abnormal; Notable for the following components:      Result Value   Rapid Strep A Screen Positive (*)    All other components within normal limits    EKG   Radiology No results found.  Procedures Procedures (including critical care time)  Medications Ordered in UC Medications - No data to display  Initial Impression / Assessment and Plan / UC Course  I have reviewed the triage vital signs and the nursing notes.  Pertinent labs & imaging  results that were available during my care of the patient were reviewed by me and considered in my medical decision making (see chart for details).  Patient is  well-appearing, she is in no acute distress, she is hypertensive, but vital signs are otherwise stable.  Rapid strep test is positive.  Will treat patient with penicillin 500 mg twice daily for the next 10 days.  For severe throat pain, patient was prescribed viscous lidocaine 2% to gargle and spit every 6 hours as needed.  Supportive care recommendations were provided to the patient to include continuing over-the-counter analgesics for pain, water gargles, and a soft diet.  Patient was advised to discard her toothbrush after 3 days.  Patient is in agreement with this plan of care and verbalizes understanding.  All questions were answered.  Patient stable for discharge.  Final Clinical Impressions(s) / UC Diagnoses   Final diagnoses:  Streptococcal sore throat     Discharge Instructions      Take medication as prescribed. Increase fluids and allow for plenty of rest. Recommend over-the-counter Tylenol or Ibuprofen as needed for pain, fever, or general discomfort. Warm salt water gargles 3-4 times daily to help with throat pain or discomfort. Recommend a diet with soft foods to include soups, broths, puddings, yogurt, Jell-O's, or popsicles until symptoms improve. Change toothbrush after 3 days. Follow-up if symptoms do not improve.     ED Prescriptions     Medication Sig Dispense Auth. Provider   penicillin v potassium (VEETID) 500 MG tablet Take 1 tablet (500 mg total) by mouth 2 (two) times daily for 10 days. 20 tablet Placida Cambre-Warren, Alda Lea, NP   lidocaine (XYLOCAINE) 2 % solution Use as directed 15 mLs in the mouth or throat every 6 (six) hours as needed for mouth pain. Gargle and spit 5 mL every 6 hours as needed for throat pain. 75 mL Aanvi Voyles-Warren, Alda Lea, NP      PDMP not reviewed this encounter.   Tish Men, NP 09/02/22 1515

## 2022-09-02 NOTE — ED Triage Notes (Signed)
Sore throat since Saturday.  Boyfriend and son both have strep.

## 2022-09-02 NOTE — Discharge Instructions (Signed)
Take medication as prescribed. Increase fluids and allow for plenty of rest. Recommend over-the-counter Tylenol or Ibuprofen as needed for pain, fever, or general discomfort. Warm salt water gargles 3-4 times daily to help with throat pain or discomfort. Recommend a diet with soft foods to include soups, broths, puddings, yogurt, Jell-O's, or popsicles until symptoms improve. Change toothbrush after 3 days. Follow-up if symptoms do not improve.

## 2022-10-27 IMAGING — CT CT ABD-PELV W/O CM
2 of 4 series · 15 of 46 positions shown, 17 images · non-contrast
Comparison: Abdominal ultrasound 11/07/2020

CLINICAL DATA: Mid to upper abdominal pain

EXAM:
CT ABDOMEN AND PELVIS WITHOUT CONTRAST
TECHNIQUE: Multidetector CT imaging of the abdomen and pelvis was performed
following the standard protocol without IV contrast.

[Series 2: axial st · axial · 0.77mm/px · z∈[+1071,+1466]mm · 12 of 89 slices shown, 14 images]
[im 5/89  soft-tissue]
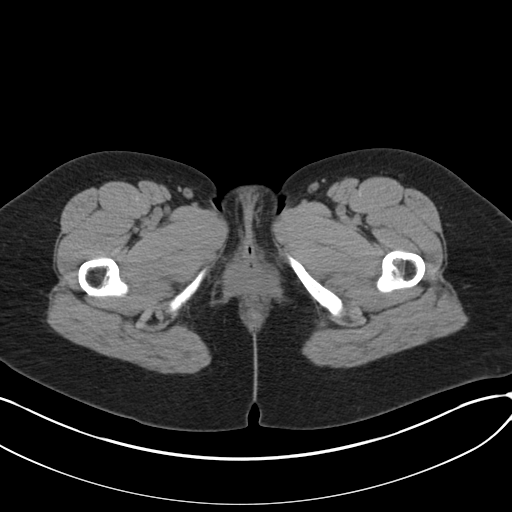
[im 5/89  bone]
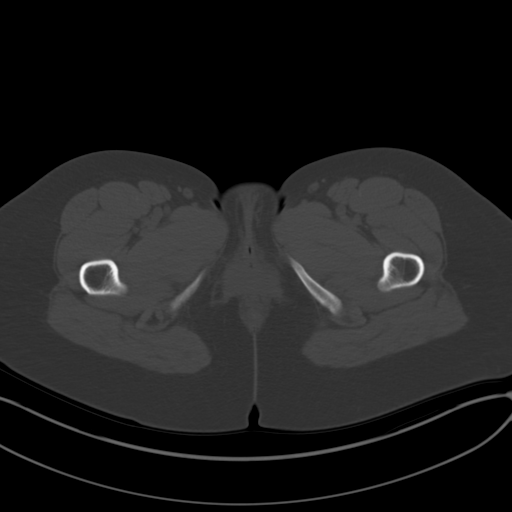
[im 14/89  soft-tissue]
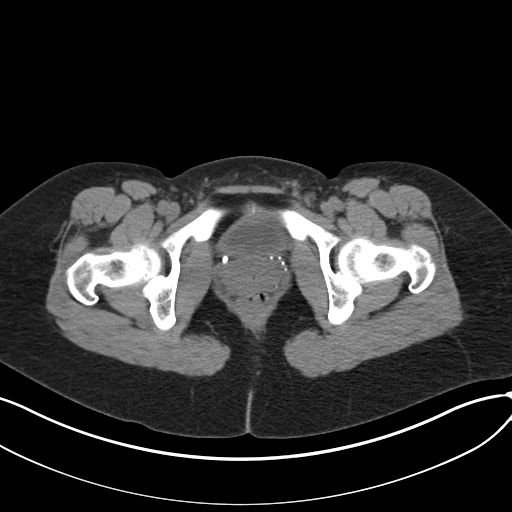
[im 18/89  soft-tissue]
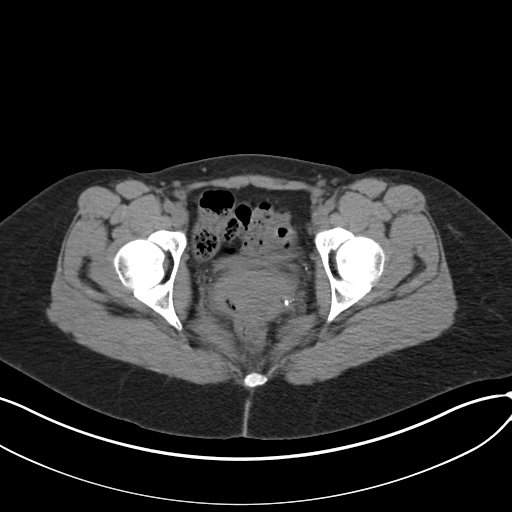
[im 27/89  soft-tissue]
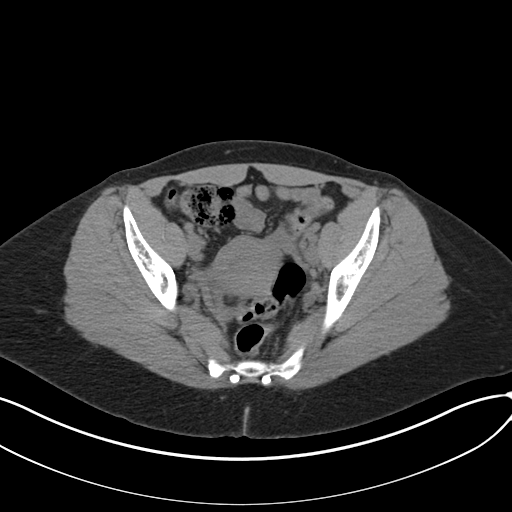
[im 36/89  soft-tissue]
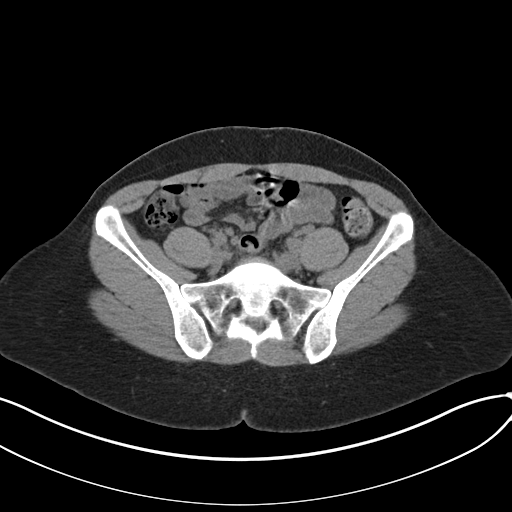
[im 40/89  soft-tissue]
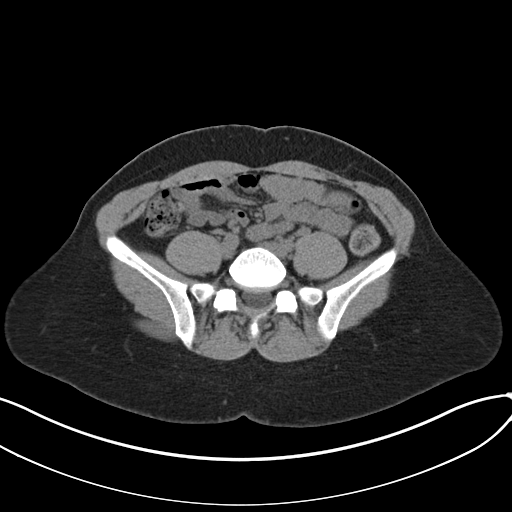
[im 49/89  soft-tissue]
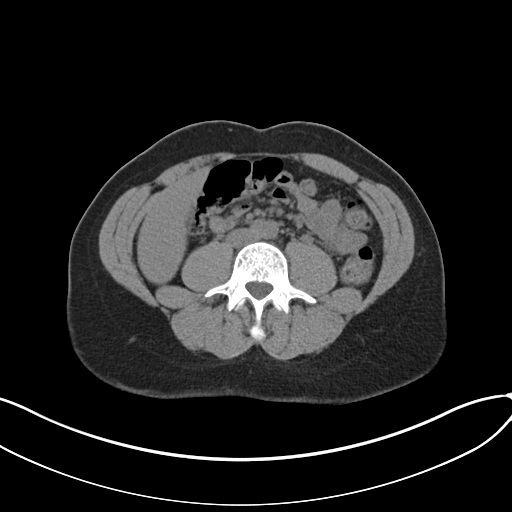
[im 53/89  soft-tissue]
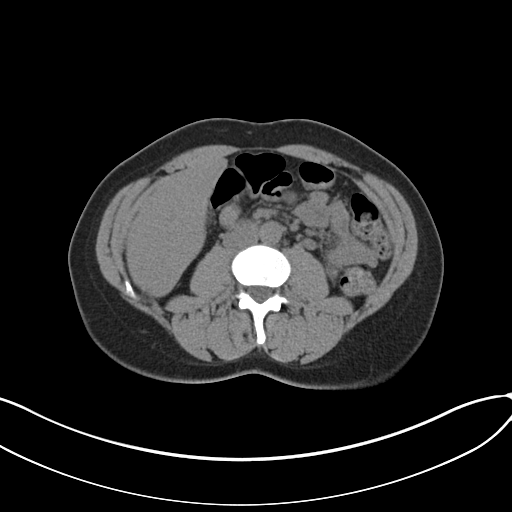
[im 62/89  soft-tissue]
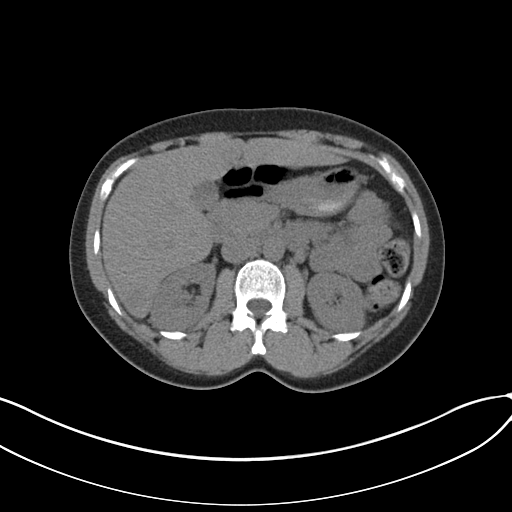
[im 62/89  bone]
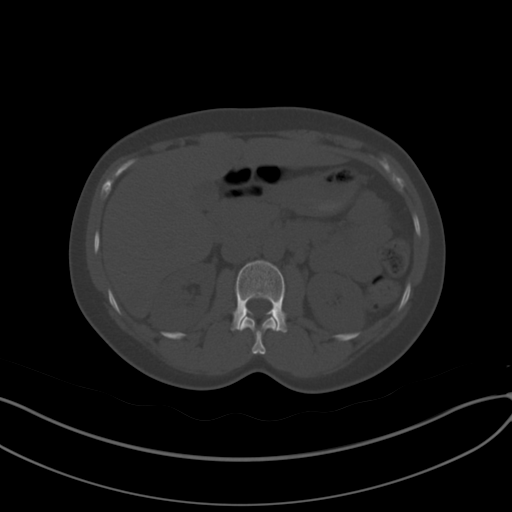
[im 71/89  soft-tissue]
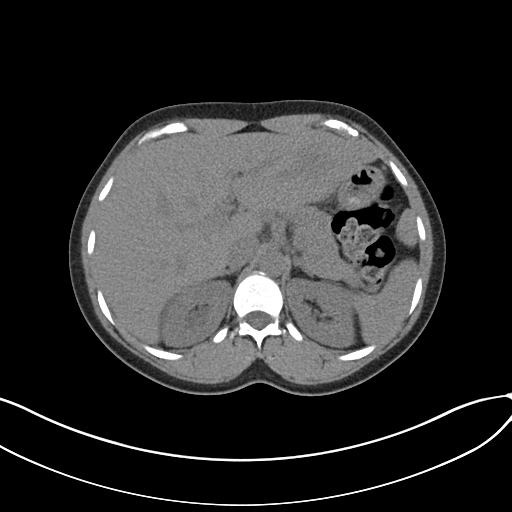
[im 75/89  soft-tissue]
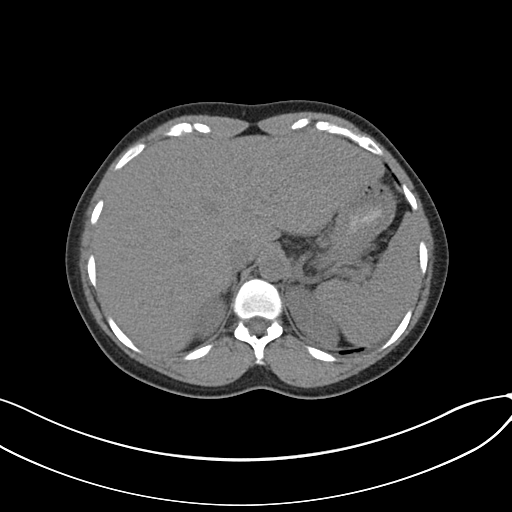
[im 84/89  soft-tissue]
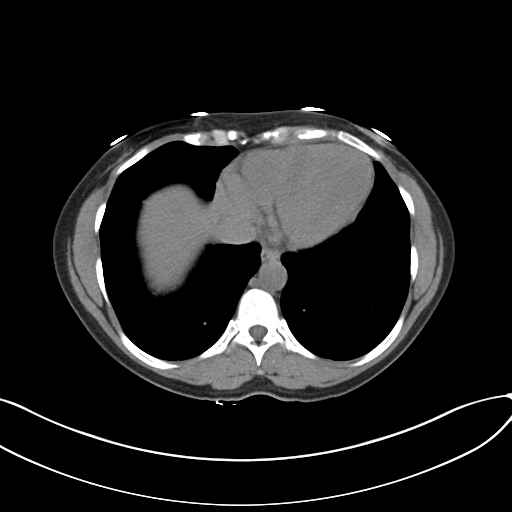

[Series 4: coronal st · coronal · 0.75mm/px · 3 of 138 slices shown]
[im 46/138  soft-tissue]
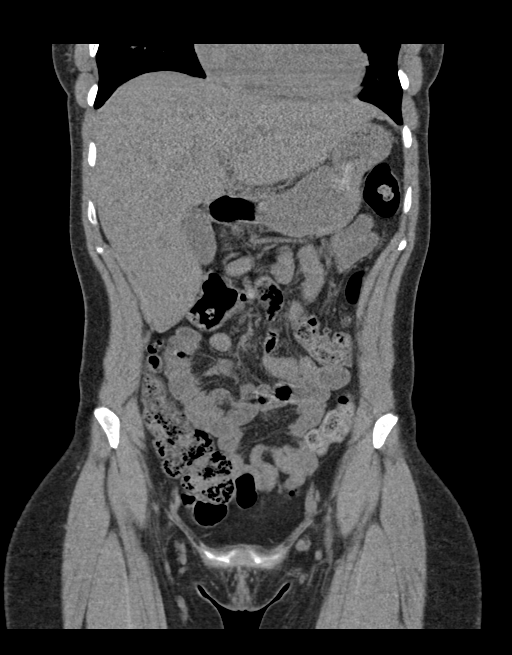
[im 61/138  soft-tissue]
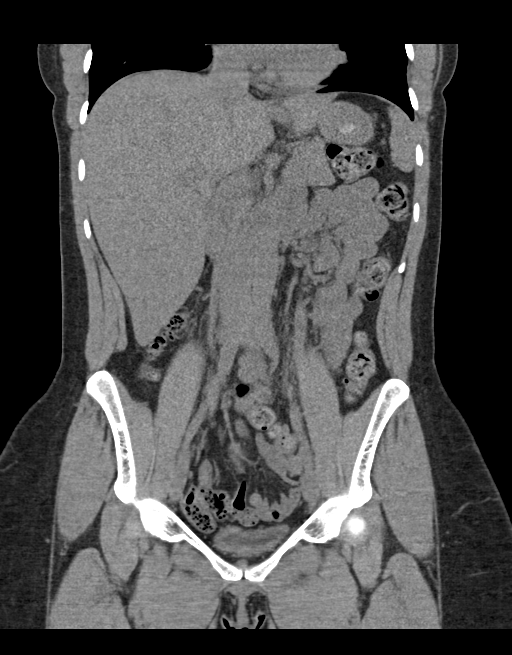
[im 77/138  soft-tissue]
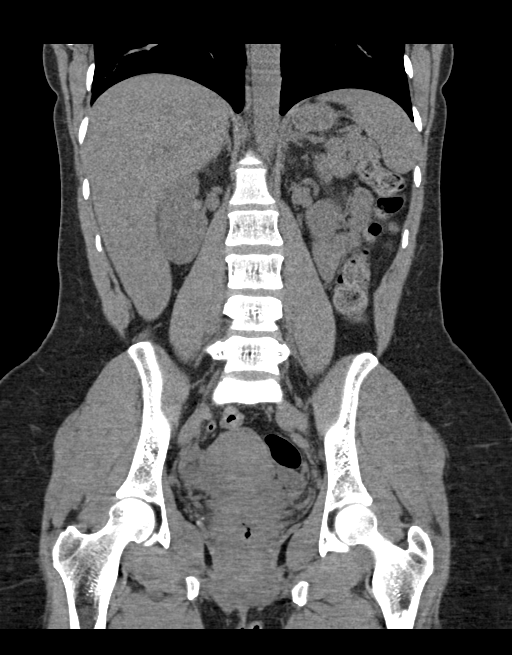

[15 of 46 positions shown; findings below may reference images not displayed]

FINDINGS: Lower chest: Lung bases are clear. Normal heart size. No pericardial
effusion.

Hepatobiliary: Hypoattenuating focus identified on comparison
abdominal ultrasound is not well visualized on this unenhanced CT.
Small subcapsular hypoattenuating focus along the anterior left lobe
([DATE]) too small to fully characterize on CT imaging but
statistically likely benign. No other focal liver abnormality.
Smooth surface contour. Normal attic attenuation. No visible
calcified gallstone. No pericholecystic fluid or inflammation. No
biliary ductal dilatation.

Pancreas: No pancreatic ductal dilatation or surrounding
inflammatory changes.

Spleen: Normal in size. No concerning splenic lesions.

Adrenals/Urinary Tract: Normal adrenal glands. Kidneys are symmetric
in size and normally located. Bilateral punctate nonobstructing
calculi in both kidneys. No obstructive urolithiasis or
hydronephrosis is seen. No visible or contour deforming renal
lesions. Urinary bladder is largely decompressed at the time of exam
and therefore poorly evaluated by CT imaging. Mild bladder wall
thickening may be related to underdistention.

Stomach/Bowel: Distal esophagus, stomach and duodenum are
unremarkable. Normal duodenal sweep across the midline abdomen. No
small bowel thickening or dilatation. Appendix is not visualized. No
focal inflammation the vicinity of the cecum to suggest an occult
appendicitis. No colonic dilatation or wall thickening.

Vascular/Lymphatic: No significant vascular findings are present. No
enlarged abdominal or pelvic lymph nodes.

Reproductive: Anteverted uterus.  No concerning adnexal lesions.

Other: No abdominopelvic free fluid or free gas. No bowel containing
hernias.

Musculoskeletal: No acute osseous abnormality or suspicious osseous
lesion.
IMPRESSION: Mild bladder wall thickening, possibly related to underdistention.
Correlate for urinary symptoms and consider urinalysis.

Bilateral nonobstructing nephrolithiasis. No obstructive urolith or
hydronephrosis at this time.

No other acute or suspicious abdominopelvic abnormality to provide
cause for patient's symptoms.

Hypoattenuating focus in the left lobe liver on comparison
ultrasound is not well visualized on CT. MR imaging as an on an
outpatient basis would further evaluate this finding.

## 2022-10-28 IMAGING — US US ABDOMEN LIMITED
1 series · 15 of 25 positions shown · non-contrast
Comparison: None.

CLINICAL DATA: 39-year-old female with right upper quadrant
abdominal pain.

EXAM:
ULTRASOUND ABDOMEN LIMITED RIGHT UPPER QUADRANT

[Series 1: us abdomen limited ruq mc & wl · 15 of 58 slices shown]
[im 1/58]
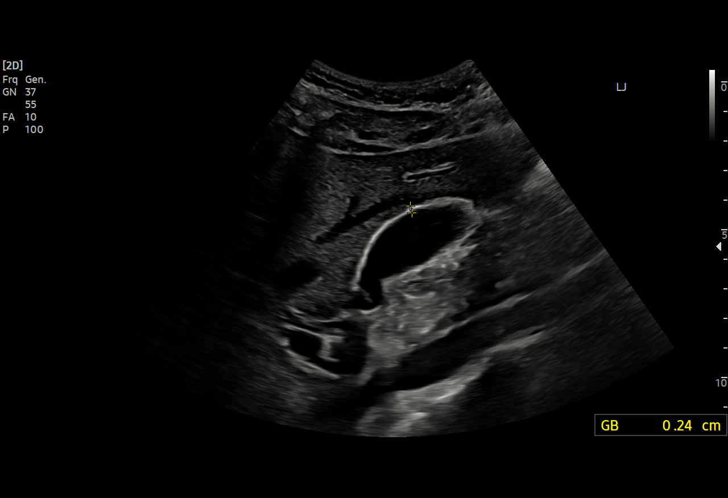
[im 5/58]
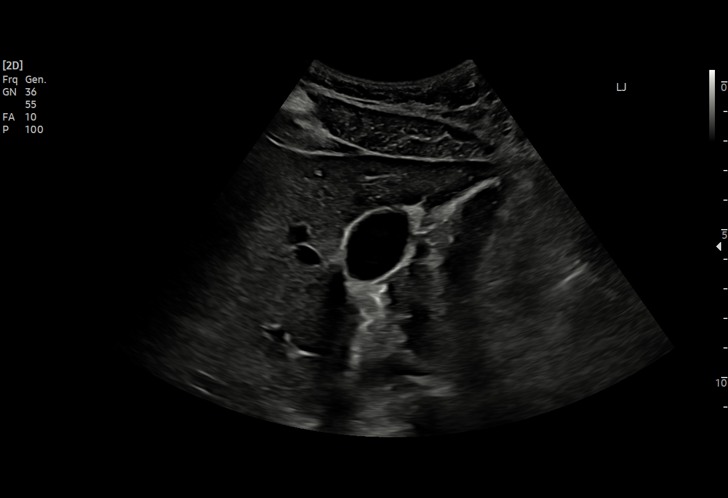
[im 10/58]
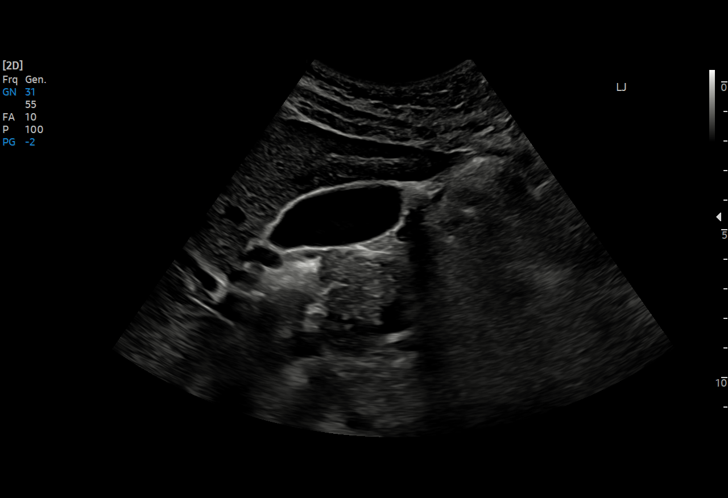
[im 12/58]
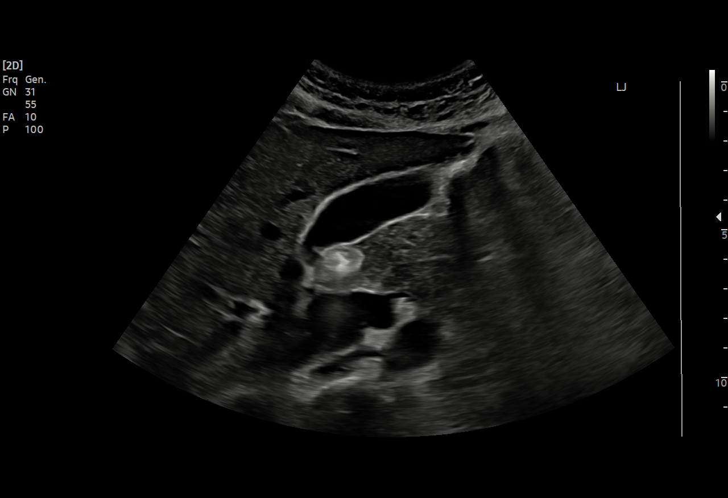
[im 17/58]
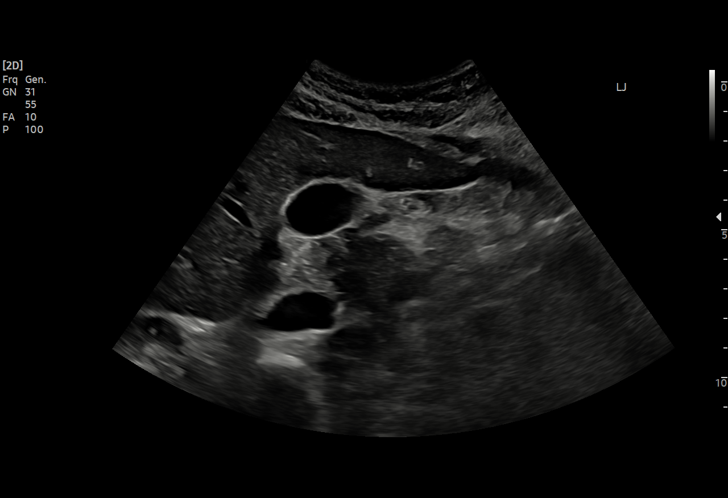
[im 22/58]
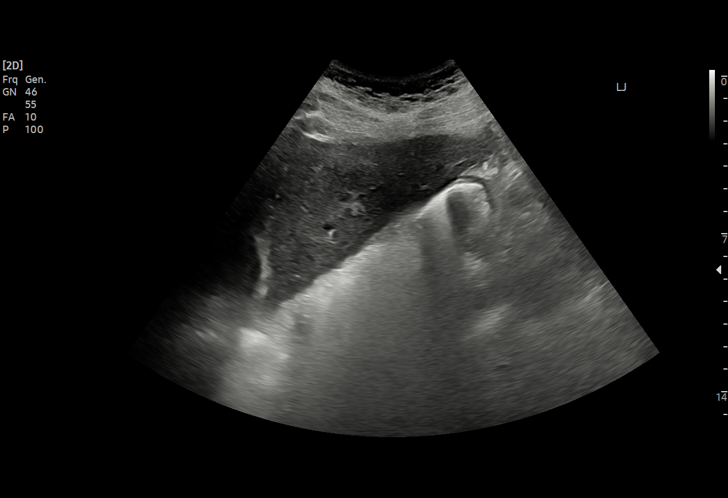
[im 24/58]
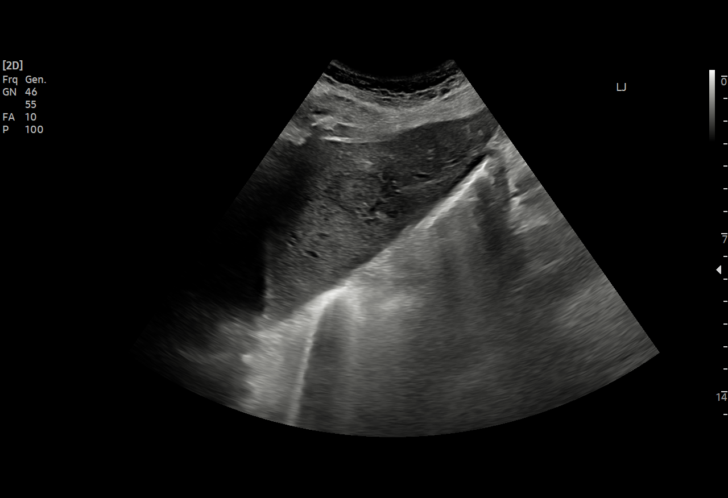
[im 29/58]
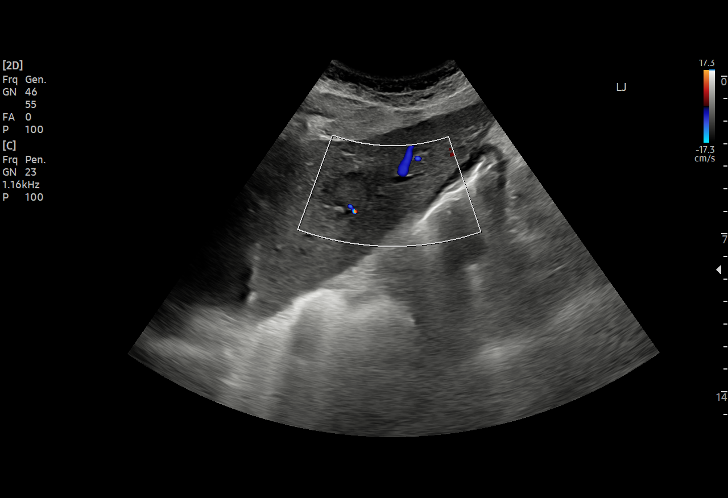
[im 34/58]
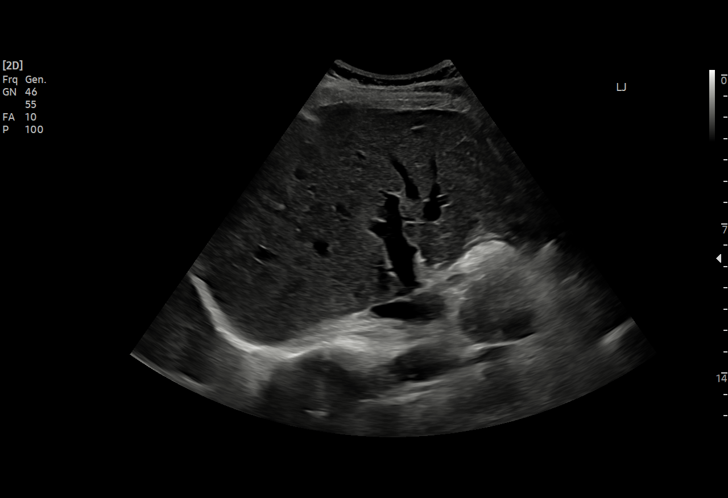
[im 36/58]
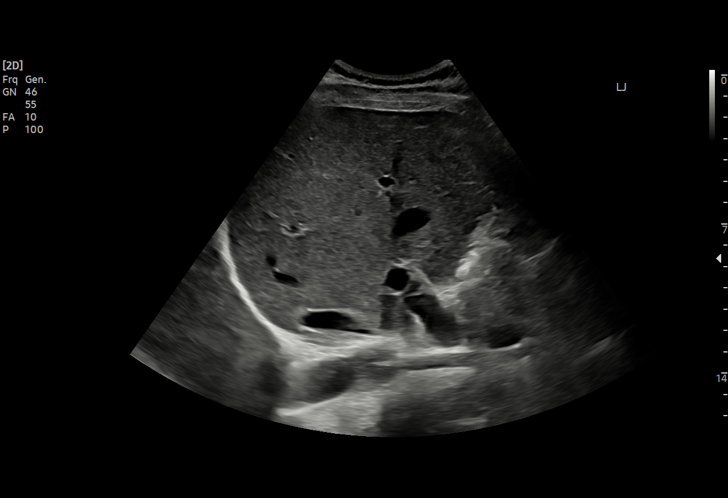
[im 41/58]
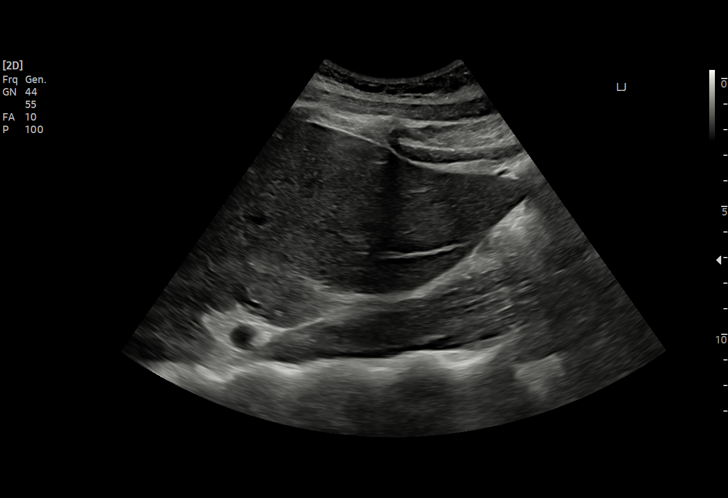
[im 46/58]
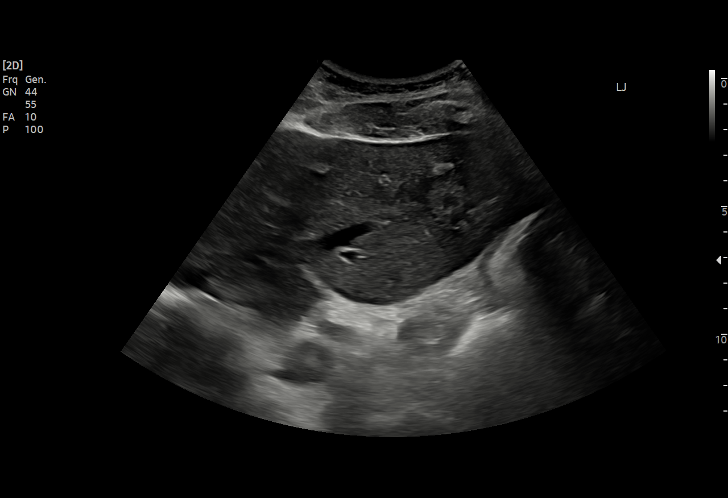
[im 48/58]
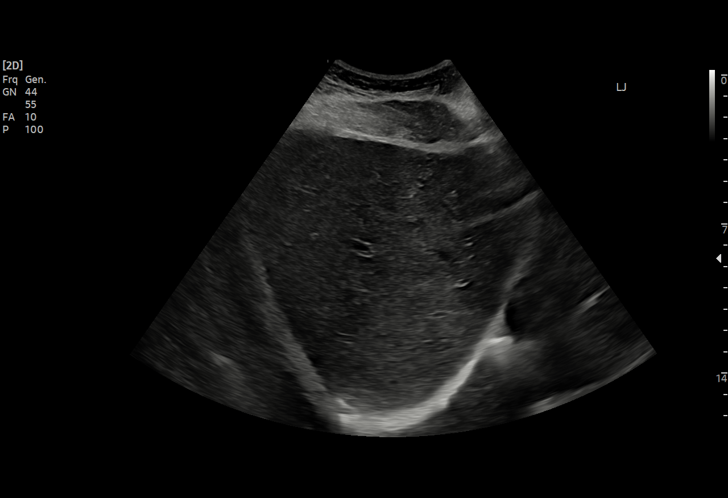
[im 53/58]
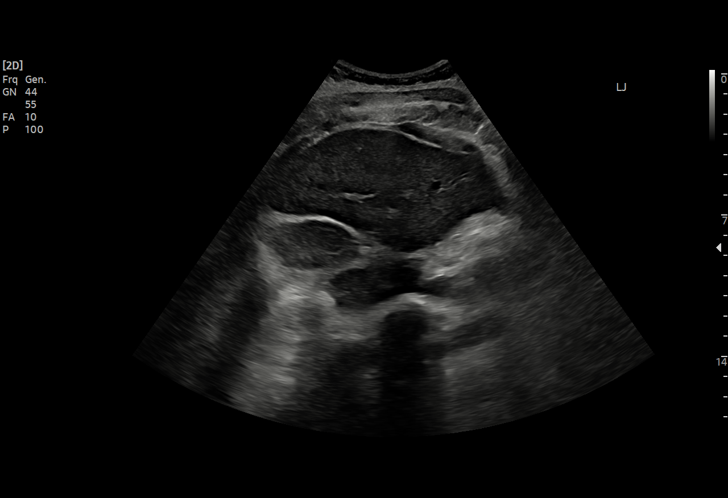
[im 58/58]
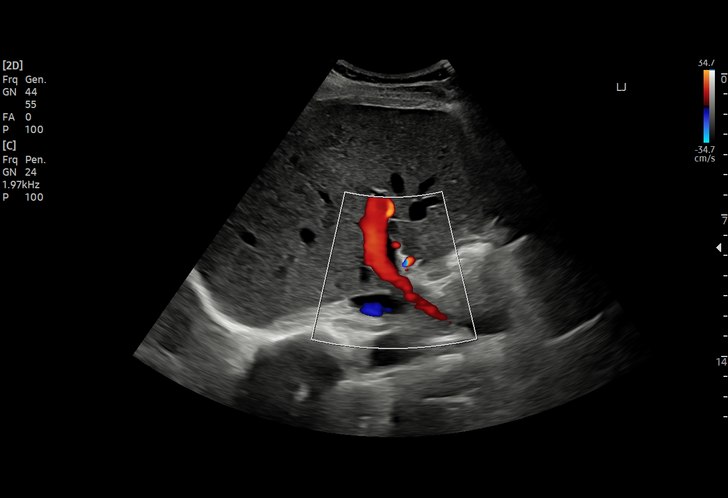

[15 of 25 positions shown; findings below may reference images not displayed]

FINDINGS: Gallbladder:

No gallstones or wall thickening visualized. No sonographic Murphy
sign noted by sonographer.

Common bile duct:

Diameter: 5 mm

Liver:

There is a 1.7 x 2.1 x 1.8 cm echogenic lesion in the left lobe of
the liver which is not characterized on this ultrasound but may
represent a hemangioma. Further characterization with MRI without
and with contrast is recommended. Portal vein is patent on color
Doppler imaging with normal direction of blood flow towards the
liver.

Other: None.
IMPRESSION: Indeterminate lesion in the left lobe of the liver may represent a
hemangioma. Further evaluation with MRI is recommended.
